# Patient Record
Sex: Male | Born: 1995 | Race: White | Hispanic: No | Marital: Single | Smoking: Never smoker
Health system: Southern US, Community
[De-identification: ages and names within clinical notes are randomized; demographics above are authoritative.]

---

## 2017-03-26 ENCOUNTER — Emergency Department (HOSPITAL_COMMUNITY)
Admission: EM | Admit: 2017-03-26 | Discharge: 2017-03-26 | Disposition: A | Discharge status: Home / Self Care | Payer: BC Managed Care – PPO | Attending: Emergency Medicine | Admitting: Emergency Medicine

## 2017-03-26 ENCOUNTER — Emergency Department (HOSPITAL_COMMUNITY): Payer: BC Managed Care – PPO

## 2017-03-26 ENCOUNTER — Encounter (HOSPITAL_COMMUNITY): Payer: Self-pay | Admitting: Emergency Medicine

## 2017-03-26 DIAGNOSIS — M791 Myalgia, unspecified site: Secondary | ICD-10-CM | POA: Insufficient documentation

## 2017-03-26 DIAGNOSIS — Y999 Unspecified external cause status: Secondary | ICD-10-CM | POA: Insufficient documentation

## 2017-03-26 DIAGNOSIS — R55 Syncope and collapse: Secondary | ICD-10-CM | POA: Diagnosis not present

## 2017-03-26 DIAGNOSIS — R51 Headache: Secondary | ICD-10-CM | POA: Diagnosis not present

## 2017-03-26 DIAGNOSIS — Y9389 Activity, other specified: Secondary | ICD-10-CM | POA: Diagnosis not present

## 2017-03-26 DIAGNOSIS — M542 Cervicalgia: Secondary | ICD-10-CM | POA: Insufficient documentation

## 2017-03-26 DIAGNOSIS — Y92410 Unspecified street and highway as the place of occurrence of the external cause: Secondary | ICD-10-CM | POA: Diagnosis not present

## 2017-03-26 MED ORDER — CYCLOBENZAPRINE HCL 5 MG PO TABS: 5.0000 mg | ORAL_TABLET | Freq: Two times a day (BID) | ORAL | 0 refills | Status: DC | PRN

## 2017-03-26 MED ORDER — IBUPROFEN 600 MG PO TABS: 600.0000 mg | ORAL_TABLET | Freq: Four times a day (QID) | ORAL | 0 refills | Status: DC | PRN

## 2017-03-26 MED ORDER — MORPHINE SULFATE (PF) 4 MG/ML IV SOLN
4.0000 mg | Freq: Once | INTRAVENOUS | Status: AC
  Administered 2017-03-26: 4 mg via INTRAVENOUS
  Filled 2017-03-26: qty 1

## 2017-03-26 NOTE — ED Notes (Signed)
ED Provider at bedside. 
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
## 2017-03-26 NOTE — ED Notes (Signed)
Patient transported to CT 

## 2017-03-26 NOTE — ED Notes (Addendum)
Pt verbalizes understanding of d/c instructions. Pt received prescriptions. Pt taken to the lobby in a wheelchair for d/c with all belongings.
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
## 2024-05-14 ENCOUNTER — Ambulatory Visit (HOSPITAL_BASED_OUTPATIENT_CLINIC_OR_DEPARTMENT_OTHER): Admission: EM | Admit: 2024-05-14 | Discharge: 2024-05-14 | Disposition: A | Discharge status: Home / Self Care

## 2024-05-14 ENCOUNTER — Encounter (HOSPITAL_BASED_OUTPATIENT_CLINIC_OR_DEPARTMENT_OTHER): Payer: Self-pay

## 2024-05-14 DIAGNOSIS — B349 Viral infection, unspecified: Secondary | ICD-10-CM | POA: Diagnosis not present

## 2024-05-14 LAB — POC COVID19/FLU A&B COMBO
Covid Antigen, POC: NEGATIVE
Influenza A Antigen, POC: NEGATIVE
Influenza B Antigen, POC: NEGATIVE

## 2024-05-14 MED ORDER — AZELASTINE HCL 0.1 % NA SOLN: 1.0000 | Freq: Two times a day (BID) | NASAL | 0 refills | Status: AC

## 2024-05-14 MED ORDER — PROMETHAZINE-DM 6.25-15 MG/5ML PO SYRP: 10.0000 mL | ORAL_SOLUTION | Freq: Three times a day (TID) | ORAL | 0 refills | Status: AC | PRN

## 2024-05-14 MED ORDER — IBUPROFEN 800 MG PO TABS: 800.0000 mg | ORAL_TABLET | Freq: Three times a day (TID) | ORAL | 0 refills | Status: AC

## 2024-05-14 NOTE — Discharge Instructions (Signed)
  1. Acute viral syndrome (Primary) - POC Covid19/Flu A&B Antigen complete in UC is negative for COVID and influenza - ibuprofen  (ADVIL ) 800 MG tablet; Take 1 tablet (800 mg total) by mouth 3 (three) times daily.  Dispense: 30 tablet; Refill: 0 - azelastine  (ASTELIN ) 0.1 % nasal spray; Place 1 spray into both nostrils 2 (two) times daily. Use in each nostril as directed  Dispense: 30 mL; Refill: 0 - promethazine -dextromethorphan (PROMETHAZINE -DM) 6.25-15 MG/5ML syrup; Take 10 mLs by mouth 3 (three) times daily as needed.  Dispense: 240 mL; Refill: 0  -Continue to monitor symptoms for any change in severity if there is any escalation of current symptoms or development of new symptoms follow-up in ER or return to UC for further evaluation and management.

## 2024-05-14 NOTE — ED Provider Notes (Signed)
 [facility]  Note:  This document was prepared using Conservation officer, historic buildings and may include unintentional dictation errors.  MRN: [id number] DOB: 06/07/1996  Subjective:   Timothy Curtis is a 28 y.o. male presenting for nasal congestion, body aches, fever/chills, sore throat, chest congestion, sinus congestion and dry cough x 2-3 days.  Patient reports he has been using DayQuil and NyQuil with minimal improvement to symptoms.  Denies any known sick contacts.  No other over-the-counter medication use for treatment of symptoms prior to arrival in urgent care.  No shortness of breath, chest pain, weakness, dizziness.  No current facility-administered medications for this encounter.  Current Outpatient Medications:    azelastine  (ASTELIN ) 0.1 % nasal spray, Place 1 spray into both nostrils 2 (two) times daily. Use in each nostril as directed, Disp: 30 mL, Rfl: 0   ibuprofen  (ADVIL ) 800 MG tablet, Take 1 tablet (800 mg total) by mouth 3 (three) times daily., Disp: 30 tablet, Rfl: 0   promethazine -dextromethorphan (PROMETHAZINE -DM) 6.25-15 MG/5ML syrup, Take 10 mLs by mouth 3 (three) times daily as needed., Disp: 240 mL, Rfl: 0   No Known Allergies  History reviewed. No pertinent past medical history.   History reviewed. No pertinent surgical history.  History reviewed. No pertinent family history.  Social History   Tobacco Use   Smoking status: Never   Smokeless tobacco: Never  Vaping Use   Vaping status: Never Used  Substance Use Topics   Alcohol use: Yes    Comment: occ   Drug use: No    ROS Refer to HPI for ROS details.  Objective:    Vitals: BP (!) 148/93 (BP Location: Left Arm)   Pulse 69   Temp 98.3 F (36.8 C) (Oral)   Resp 20   SpO2 97%   Physical Exam Vitals and nursing note reviewed.  Constitutional:      General: He is not in acute distress.    Appearance: Normal appearance. He is well-developed. He is not ill-appearing,  toxic-appearing or diaphoretic.  HENT:     Head: Normocephalic.     Nose: Congestion present. No rhinorrhea.     Mouth/Throat:     Mouth: Mucous membranes are moist.     Pharynx: Oropharynx is clear.  Cardiovascular:     Rate and Rhythm: Normal rate.  Pulmonary:     Effort: Pulmonary effort is normal. No respiratory distress.     Breath sounds: No stridor. No wheezing.  Chest:     Chest wall: No tenderness.  Skin:    General: Skin is warm and dry.  Neurological:     General: No focal deficit present.     Mental Status: He is alert and oriented to person, place, and time.  Psychiatric:        Mood and Affect: Mood normal.        Behavior: Behavior normal.     Procedures  Results for orders placed or performed during the hospital encounter of 05/14/24 (from the past 24 hours)  POC Covid19/Flu A&B Antigen     Status: Normal   Collection Time: 05/14/24 10:50 AM  Result Value Ref Range   Influenza A Antigen, POC Negative Negative   Influenza B Antigen, POC Negative Negative   Covid Antigen, POC Negative Negative    Assessment and Plan :     Discharge Instructions       1. Acute viral syndrome (Primary) - POC Covid19/Flu A&B Antigen complete in UC is negative for COVID and influenza -  ibuprofen  (ADVIL ) 800 MG tablet; Take 1 tablet (800 mg total) by mouth 3 (three) times daily.  Dispense: 30 tablet; Refill: 0 - azelastine  (ASTELIN ) 0.1 % nasal spray; Place 1 spray into both nostrils 2 (two) times daily. Use in each nostril as directed  Dispense: 30 mL; Refill: 0 - promethazine -dextromethorphan (PROMETHAZINE -DM) 6.25-15 MG/5ML syrup; Take 10 mLs by mouth 3 (three) times daily as needed.  Dispense: 240 mL; Refill: 0  -Continue to monitor symptoms for any change in severity if there is any escalation of current symptoms or development of new symptoms follow-up in ER or return to UC for further evaluation and management.      Asanti Craigo B Karielle Davidow   Sharnae Winfree, [place],  [place] 05/14/24 1212

## 2024-05-14 NOTE — ED Triage Notes (Signed)
 Pt states for the last 3 day he has been having a cough-mostly dry, nasal congestion, body aches, chills/sweats, sore throat, facial pain, and chest tightness when he has a deep cough. Pt has taken dayquil and nyquil with slight relief.
